# Patient Record
Sex: Male | Born: 1935 | Race: White | Hispanic: No | Marital: Married
Health system: Southern US, Community
[De-identification: ages and names within clinical notes are randomized; demographics above are authoritative.]

---

## 2006-05-13 ENCOUNTER — Ambulatory Visit: Payer: Self-pay | Admitting: Pulmonary Disease

## 2006-05-13 ENCOUNTER — Inpatient Hospital Stay (HOSPITAL_COMMUNITY): Admission: EM | Admit: 2006-05-13 | Discharge: 2006-05-14 | Payer: Self-pay | Admitting: Emergency Medicine

## 2006-05-13 ENCOUNTER — Ambulatory Visit: Payer: Self-pay | Admitting: *Deleted

## 2007-06-19 IMAGING — CR DG CHEST 1V PORT
1 series · 1 of 1 positions shown · non-contrast
Comparison: None.

CLINICAL DATA: Chest pain. Diaphoresis. Syncope. Vomiting.

PORTABLE CHEST - 1 VIEW

[view not recorded]
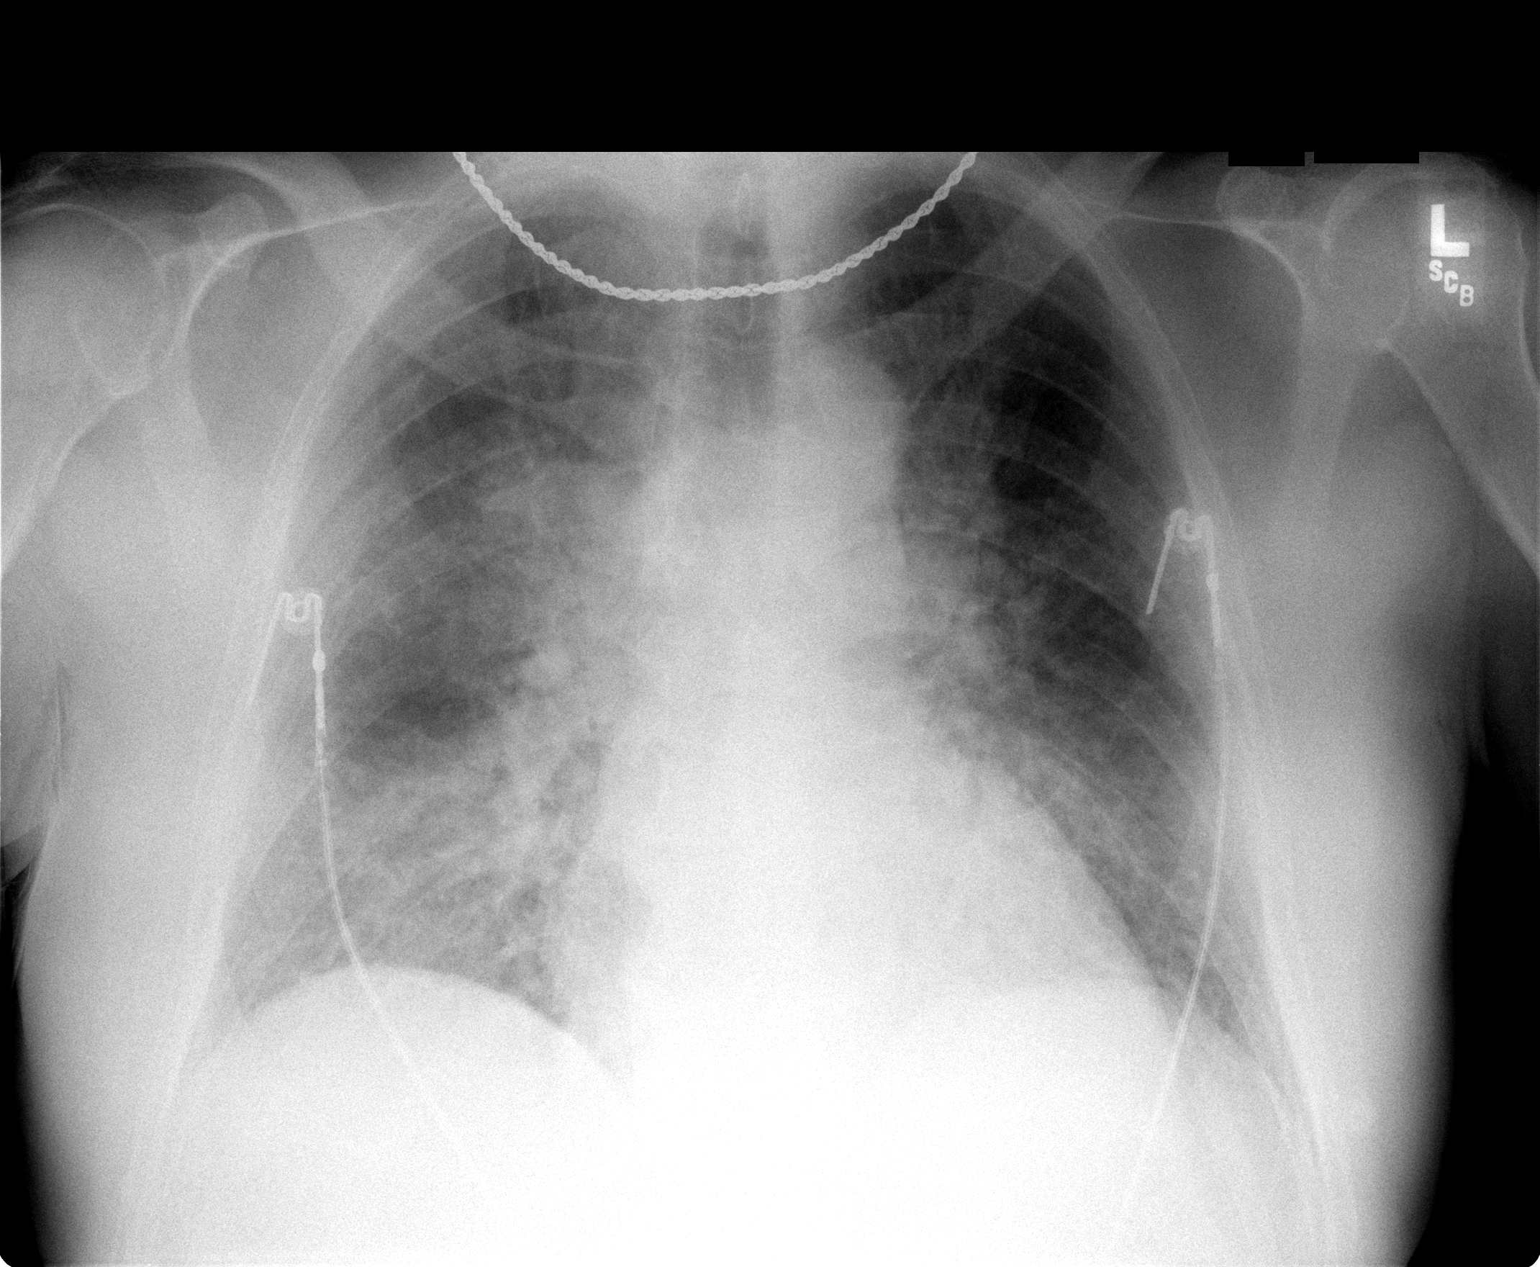

[1 of 1 positions shown; findings below may reference images not displayed]

FINDINGS: Mildly enlarged cardiac silhouette. Diffusely prominent pulmonary
vasculature and interstitial markings. Suggestion of scattered alveolar
opacities without focal airspace consolidation. No pleural fluid. Thoracic spine
degenerative changes.  

IMPRESSION

1. Moderate to marked interstitial lung disease with possible scattered alveolar
opacities. This could represent pulmonary edema, infection or chronic
interstitial disease.

2. Mild cardiomegaly.

## 2015-07-22 ENCOUNTER — Ambulatory Visit: Payer: Self-pay | Admitting: General Surgery

## 2015-07-22 NOTE — H&P (Signed)
History of Present Illness (Jerral Mccauley MD; 07/22/2015 11:50 AM) The patient is a 81 year old male who presents with an incisional hernia. Patient is a 81-year-old male who is referred by Dr. William Harris for evaluation of a ventral hernia. Patient underwent CT scan which revealed a hernia, incarcerated with a hernia neck for approximately 3.5 cm.    Review of Systems (Caro Brundidge, MD; 07/22/2015 11:49 00) General Not Present- Appetite Loss, Chills, Fatigue, Fever, Night Sweats, Weight Gain and Weight Loss. Skin Not Present- Change in Wart/Mole, Dryness, Hives, Jaundice, New Lesions, Non-Healing Wounds, Rash and Ulcer. HEENT Not Present- Earache, Hearing Loss, Hoarseness, Nose Bleed, Oral Ulcers, Ringing in the Ears, Seasonal Allergies, Sinus Pain, Sore Throat, Visual Disturbances, Wears glasses/contact lenses and Yellow Eyes. Respiratory Not Present- Bloody sputum, Chronic Cough, Difficulty Breathing, Snoring and Wheezing. Breast Not Present- Breast Mass, Breast Pain, Nipple Discharge and Skin Changes. Cardiovascular Not Present- Chest Pain, Difficulty Breathing Lying Down, Leg Cramps, Palpitations, Rapid Heart Rate, Shortness of Breath and Swelling of Extremities. Gastrointestinal Not Present- Abdominal Pain, Bloating, Bloody Stool, Change in Bowel Habits, Chronic diarrhea, Constipation, Difficulty Swallowing, Excessive gas, Gets full quickly at meals, Hemorrhoids, Indigestion, Nausea, Rectal Pain and Vomiting. Male Genitourinary Not Present- Blood in Urine, Change in Urinary Stream, Frequency, Impotence, Nocturia, Painful Urination, Urgency and Urine Leakage. Musculoskeletal Not Present- Back Pain, Joint Pain, Joint Stiffness, Muscle Pain, Muscle Weakness and Swelling of Extremities. Neurological Not Present- Decreased Memory, Fainting, Headaches, Numbness, Seizures, Tingling, Tremor, Trouble walking and Weakness. Psychiatric Not Present- Anxiety, Bipolar, Change in Sleep Pattern,  Depression, Fearful and Frequent crying. Endocrine Not Present- Cold Intolerance, Excessive Hunger, Hair Changes, Heat Intolerance and New Diabetes. Hematology Not Present- Easy Bruising, Excessive bleeding, Gland problems, HIV and Persistent Infections.   Physical Exam (Chelli Yerkes, MD; 07/22/2015 11:49 00) General Mental Status-Alert. General Appearance-Consistent with stated age. Hydration-Well hydrated. Voice-Normal.  Head and Neck Head-normocephalic, atraumatic with no lesions or palpable masses. Trachea-midline. Thyroid Gland Characteristics - normal size and consistency.  Chest and Lung Exam Chest and lung exam reveals -quiet, even and easy respiratory effort with no use of accessory muscles and on auscultation, normal breath sounds, no adventitious sounds and normal vocal resonance. Inspection Chest Wall - Normal. Back - normal.  Cardiovascular Cardiovascular examination reveals -normal heart sounds, regular rate and rhythm with no murmurs and normal pedal pulses bilaterally.  Abdomen Inspection Skin - Scar - no surgical scars. Hernias - Ventral hernia - Reducible(Large ventral hernia just superior to the umbilicus, neck feels approximately 3-4 cm.). Palpation/Percussion Normal exam - Soft, Non Tender, No Rebound tenderness, No Rigidity (guarding) and No hepatosplenomegaly. Auscultation Normal exam - Bowel sounds normal.    Assessment & Plan (Jaki Steptoe MD; 07/22/2015 11:51 AM) VENTRAL HERNIA WITHOUT OBSTRUCTION OR GANGRENE (K43.9) Impression: 81-year-old male with a ventral primary hernia.  1. We will proceed to the operative her laparoscopic ventral hernia repair with mesh. 2. Discussed with patient the risks and benefits of the procedure to include but not limited to: Infection, bleeding, damage to structures, possible recurrence and pain. Patient was understanding and wishes to proceed.
